# Patient Record
Sex: Female | Born: 1965 | Race: Asian | Hispanic: No | Marital: Married | Smoking: Never smoker
Health system: Southern US, Community
[De-identification: ages and names within clinical notes are randomized; demographics above are authoritative.]

---

## 2007-06-11 ENCOUNTER — Telehealth (INDEPENDENT_AMBULATORY_CARE_PROVIDER_SITE_OTHER): Payer: Self-pay | Admitting: *Deleted

## 2007-07-03 ENCOUNTER — Ambulatory Visit: Payer: Self-pay | Admitting: Nurse Practitioner

## 2007-07-03 DIAGNOSIS — N912 Amenorrhea, unspecified: Secondary | ICD-10-CM

## 2007-07-03 LAB — CONVERTED CEMR LAB
ALT: 13 units/L (ref 0–35)
AST: 19 units/L (ref 0–37)
Basophils Absolute: 0 10*3/uL (ref 0.0–0.1)
Basophils Relative: 0 % (ref 0–1)
Beta hcg, urine, semiquantitative: POSITIVE
Calcium: 9.3 mg/dL (ref 8.4–10.5)
Chloride: 104 meq/L (ref 96–112)
Creatinine, Ser: 0.45 mg/dL (ref 0.40–1.20)
Hemoglobin: 11.8 g/dL — ABNORMAL LOW (ref 12.0–15.0)
MCHC: 33.7 g/dL (ref 30.0–36.0)
Monocytes Absolute: 0.6 10*3/uL (ref 0.2–0.7)
Neutro Abs: 6 10*3/uL (ref 1.7–7.7)
Neutrophils Relative %: 61 % (ref 43–77)
Platelets: 311 10*3/uL (ref 150–400)
RDW: 13.9 % (ref 11.5–14.0)
Sodium: 139 meq/L (ref 135–145)
Total Bilirubin: 0.3 mg/dL (ref 0.3–1.2)
Total Protein: 7.2 g/dL (ref 6.0–8.3)

## 2007-07-04 ENCOUNTER — Encounter (INDEPENDENT_AMBULATORY_CARE_PROVIDER_SITE_OTHER): Payer: Self-pay | Admitting: Nurse Practitioner

## 2007-09-24 ENCOUNTER — Ambulatory Visit (HOSPITAL_COMMUNITY): Admission: RE | Admit: 2007-09-24 | Discharge: 2007-09-24 | Payer: Self-pay | Admitting: Obstetrics & Gynecology

## 2007-11-17 ENCOUNTER — Ambulatory Visit (HOSPITAL_COMMUNITY): Admission: RE | Admit: 2007-11-17 | Discharge: 2007-11-17 | Payer: Self-pay | Admitting: Obstetrics & Gynecology

## 2007-12-10 ENCOUNTER — Ambulatory Visit (HOSPITAL_COMMUNITY): Admission: RE | Admit: 2007-12-10 | Discharge: 2007-12-10 | Payer: Self-pay | Admitting: Family Medicine

## 2007-12-22 ENCOUNTER — Ambulatory Visit: Payer: Self-pay | Admitting: Obstetrics and Gynecology

## 2007-12-22 ENCOUNTER — Encounter (HOSPITAL_COMMUNITY): Payer: Self-pay | Admitting: Obstetrics and Gynecology

## 2007-12-22 ENCOUNTER — Inpatient Hospital Stay (HOSPITAL_COMMUNITY): Admission: AD | Admit: 2007-12-22 | Discharge: 2007-12-23 | Payer: Self-pay | Admitting: Obstetrics & Gynecology

## 2008-05-04 IMAGING — US US OB DETAIL+14 WK
1 of 2 series · 14 of 28 positions shown · non-contrast
Comparison: none

OBSTETRICAL ULTRASOUND:

 This ultrasound exam was performed in the [HOSPITAL] Ultrasound Department.  The OB US report was generated in the AS system, and faxed to the ordering physician.  This report is also available in [REDACTED] PACS.

[Series 1: us ob detail +14 wk · 14 of 74 slices shown]
[im 1/74]
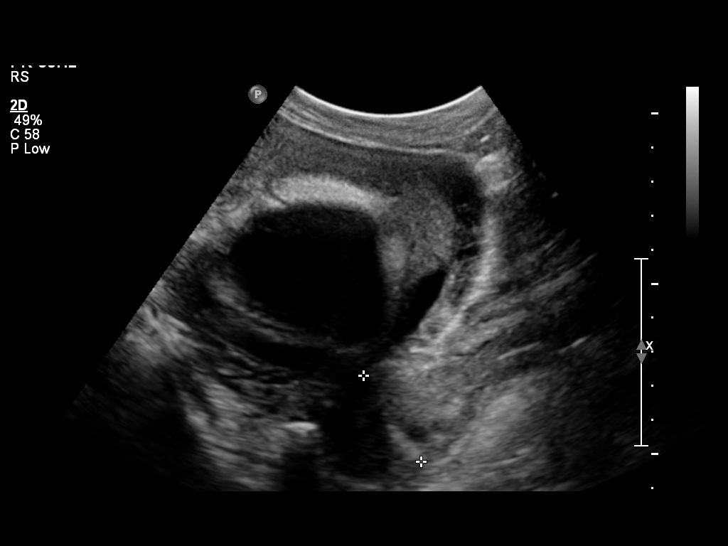
[im 6/74]
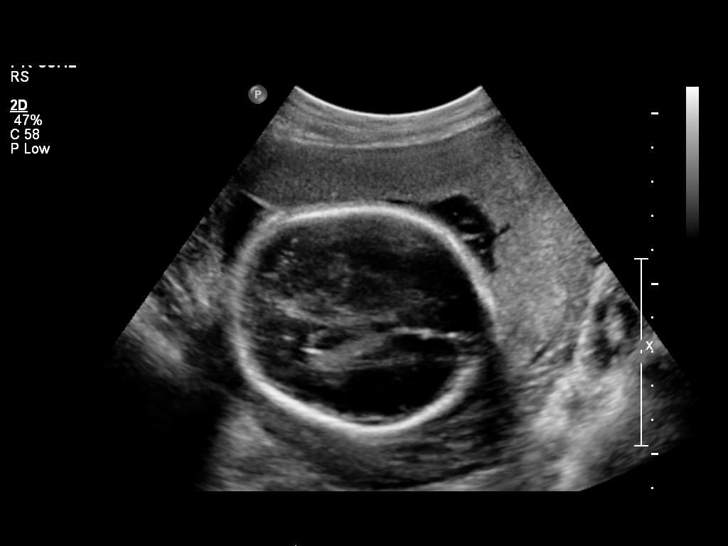
[im 12/74]
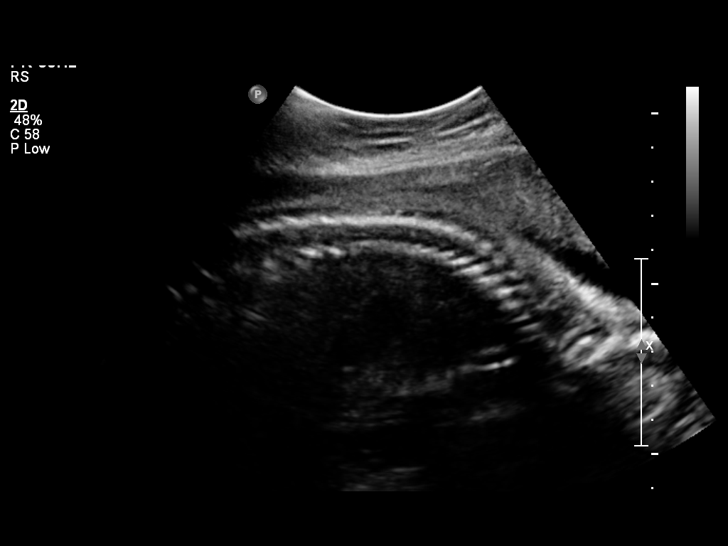
[im 17/74]
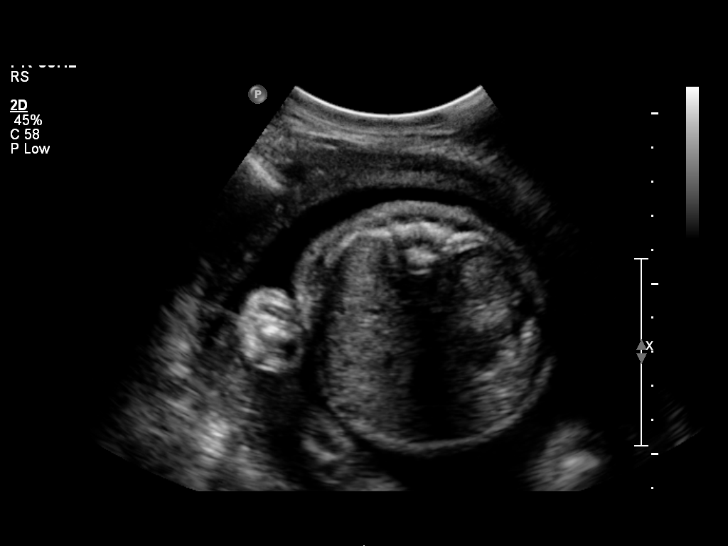
[im 23/74]
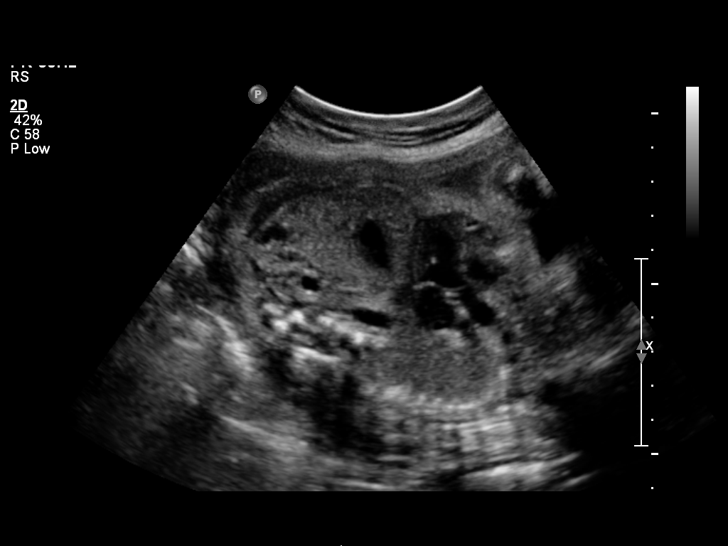
[im 29/74]
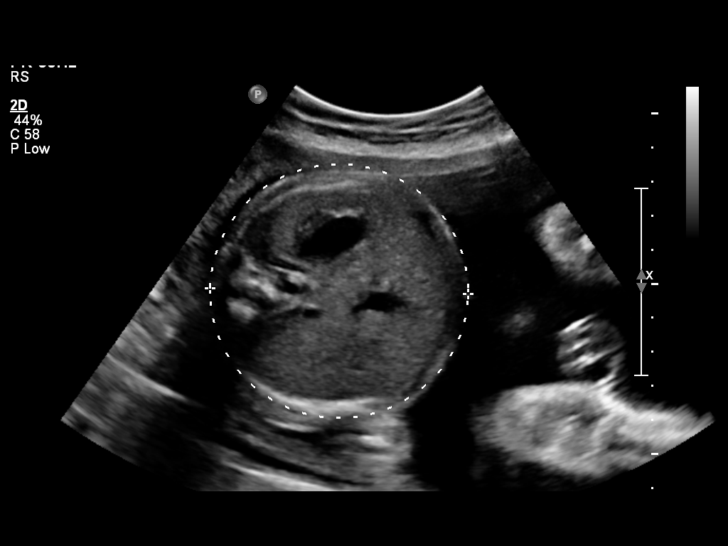
[im 34/74]
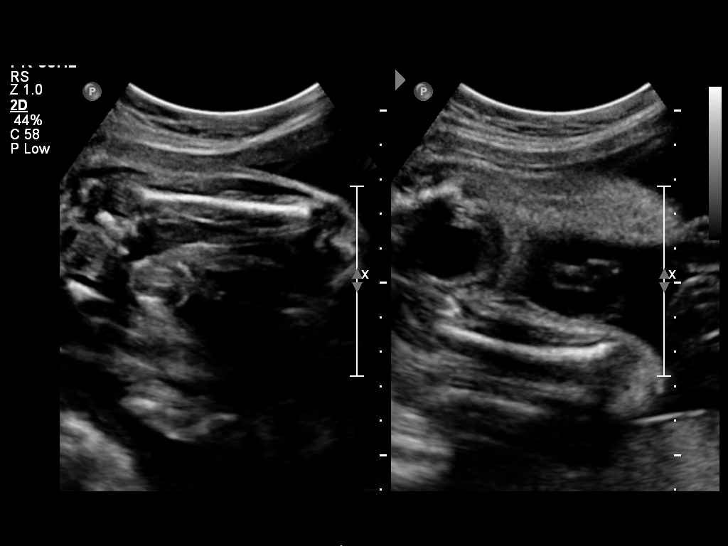
[im 40/74]
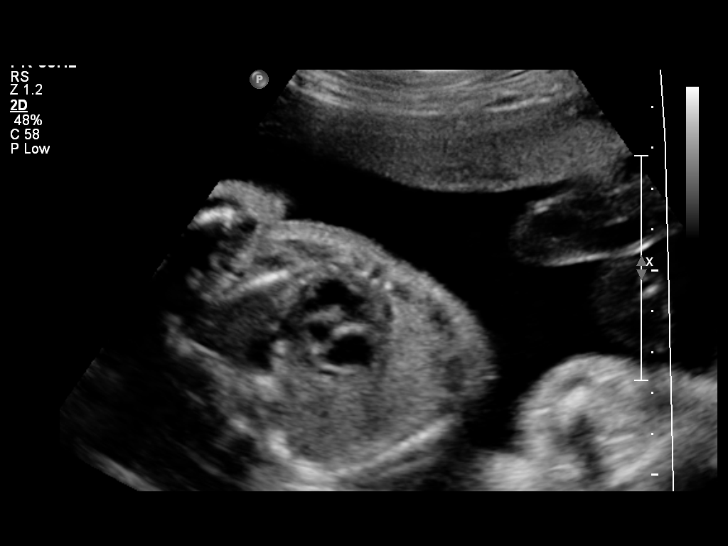
[im 45/74]
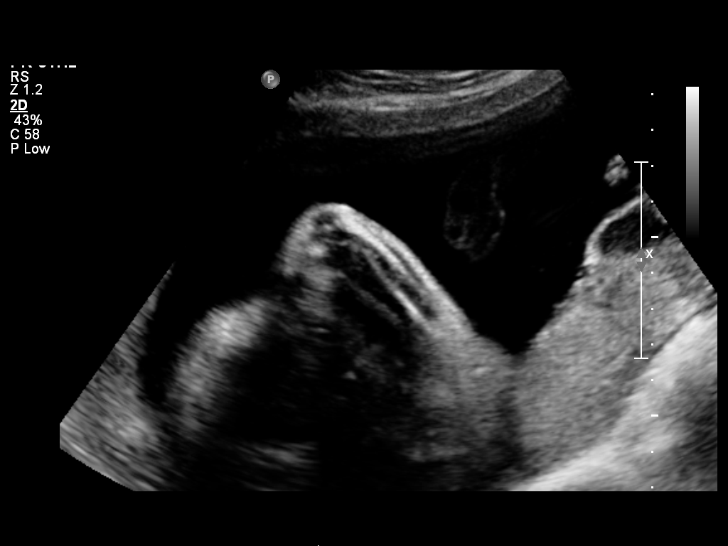
[im 51/74]
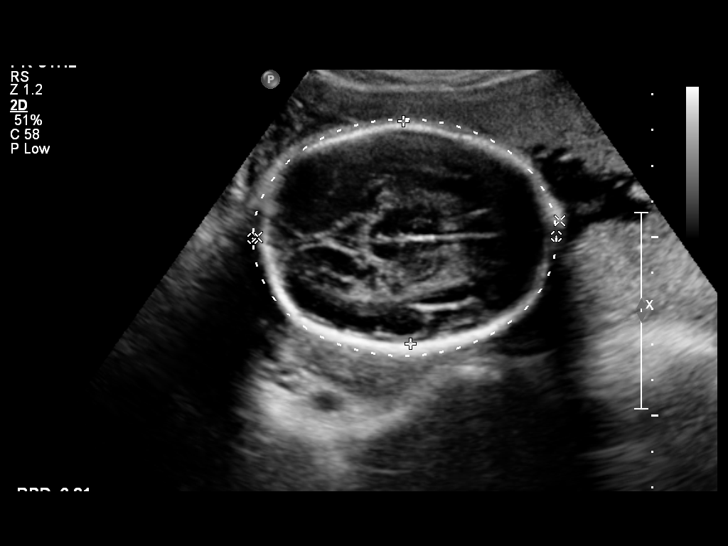
[im 57/74]
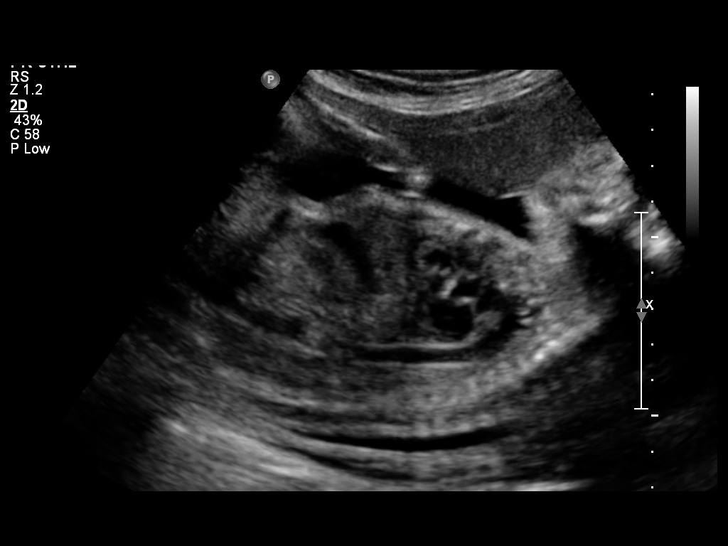
[im 62/74]
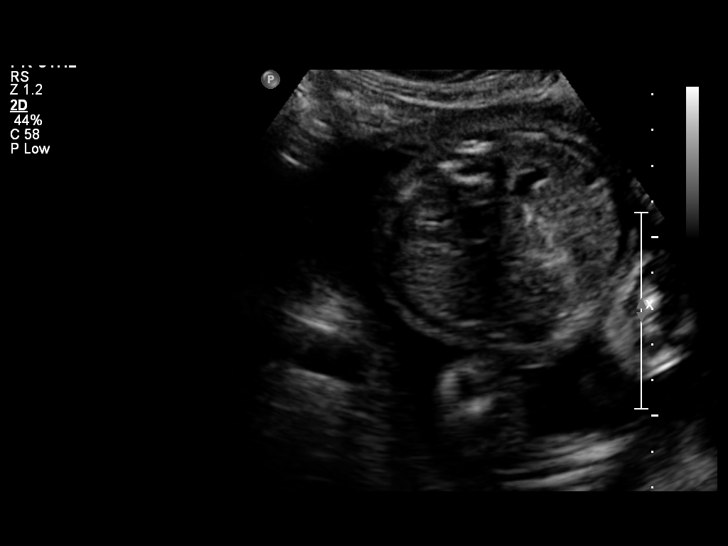
[im 68/74]
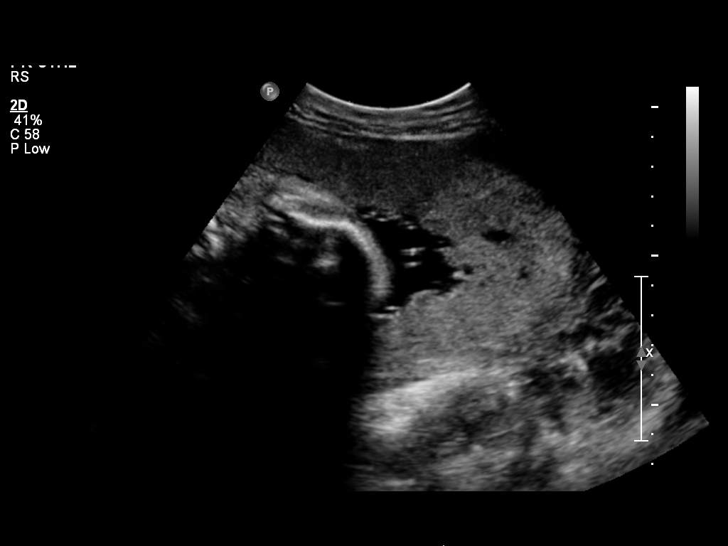
[im 74/74]
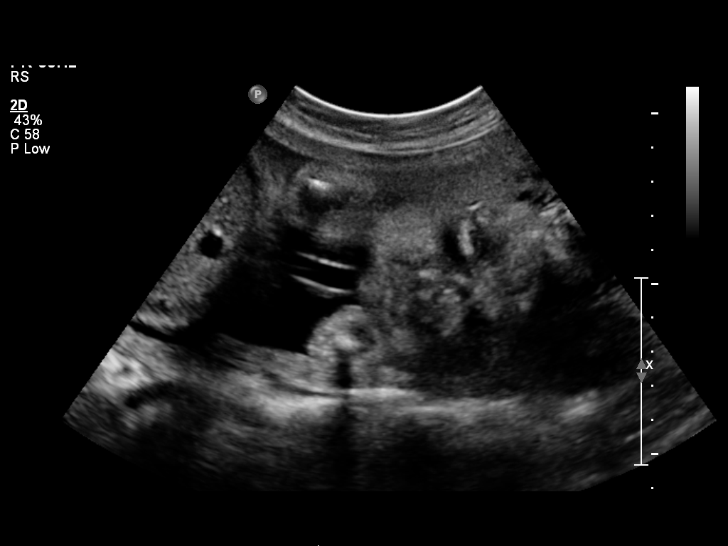

[14 of 28 positions shown; findings below may reference images not displayed]

IMPRESSION: See AS Obstetric US report.

## 2008-06-27 IMAGING — US US OB FOLLOW-UP
1 series · 14 of 28 positions shown · non-contrast
Comparison: none

OBSTETRICAL ULTRASOUND:
 This ultrasound exam was performed in the [HOSPITAL] Ultrasound Department.  The OB US report was generated in the AS system, and faxed to the ordering physician.  This report is also available in [REDACTED] PACS.

[Series 1: us ob follow-up · 0.26mm/px · 14 of 36 slices shown]
[im 2/36]
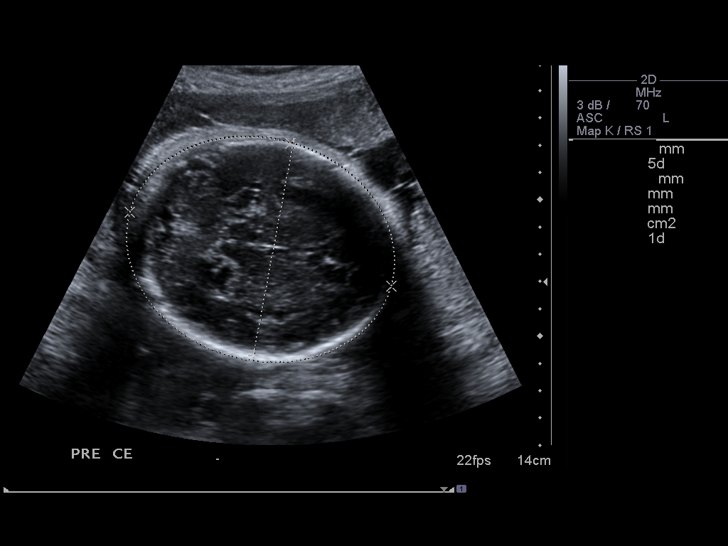
[im 4/36]
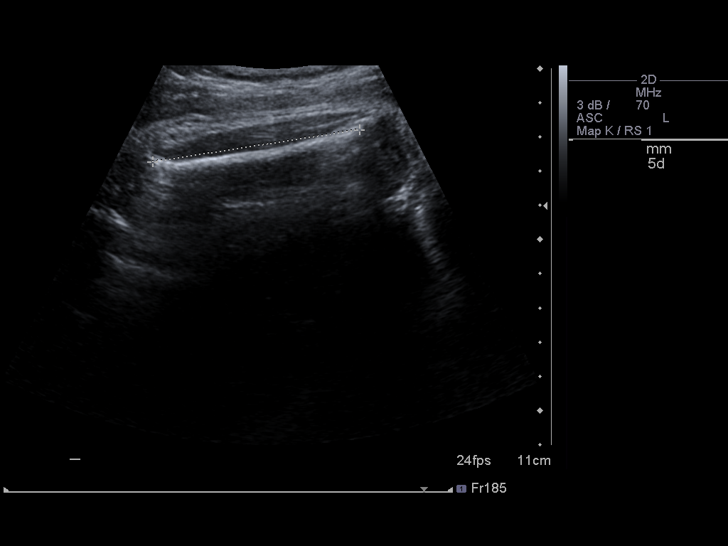
[im 7/36]
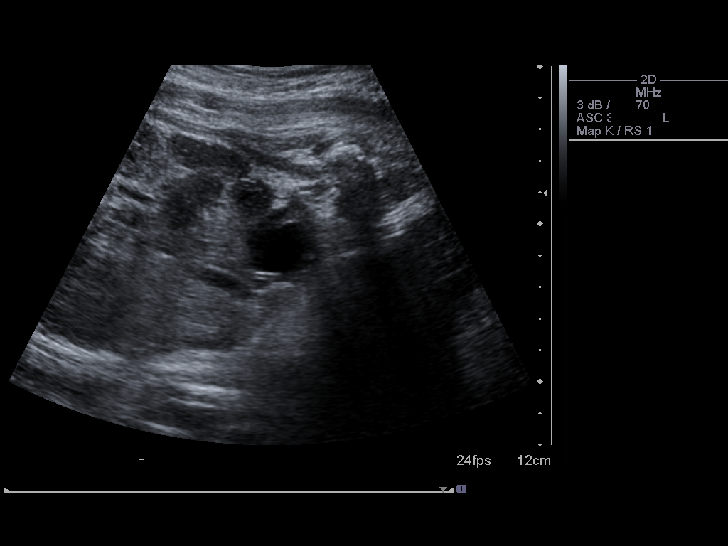
[im 10/36]
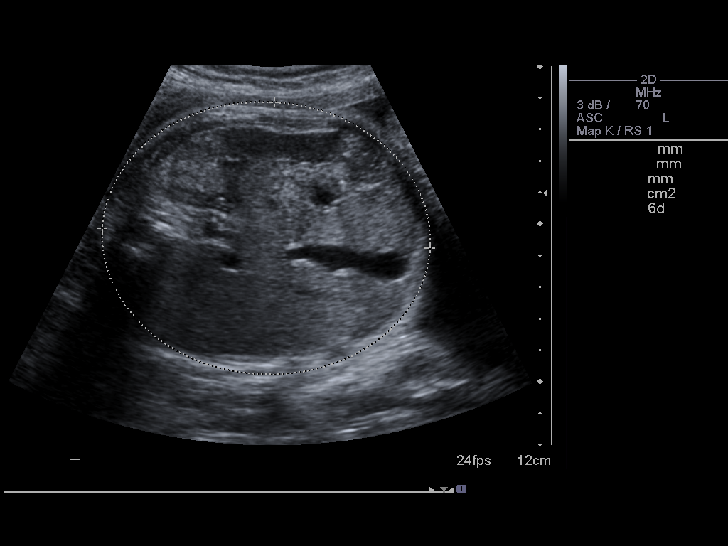
[im 12/36]
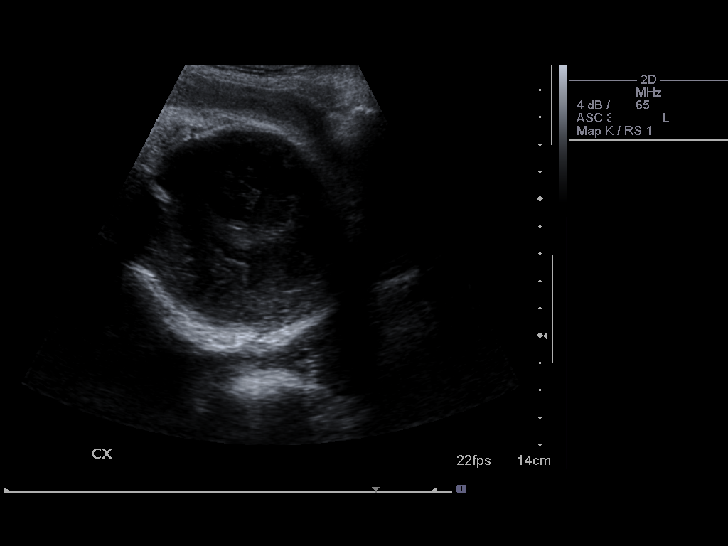
[im 15/36]
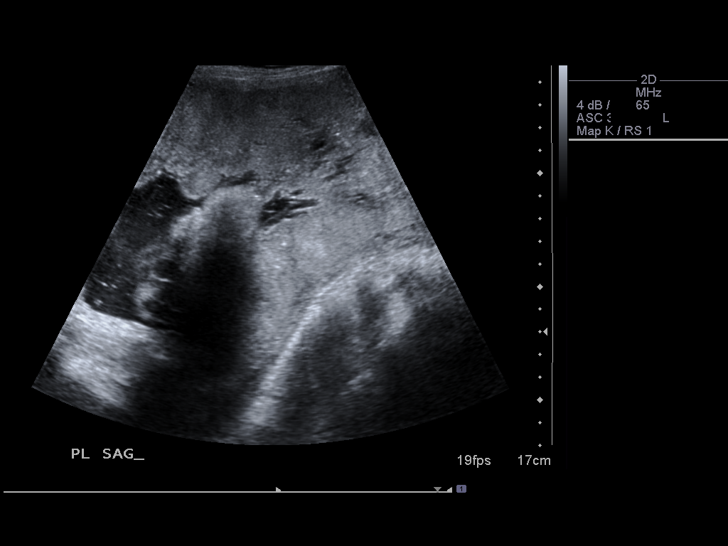
[im 17/36]
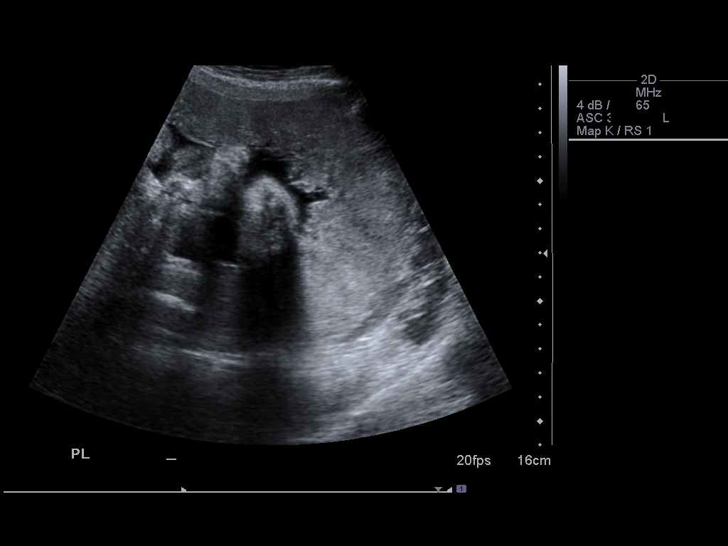
[im 20/36]
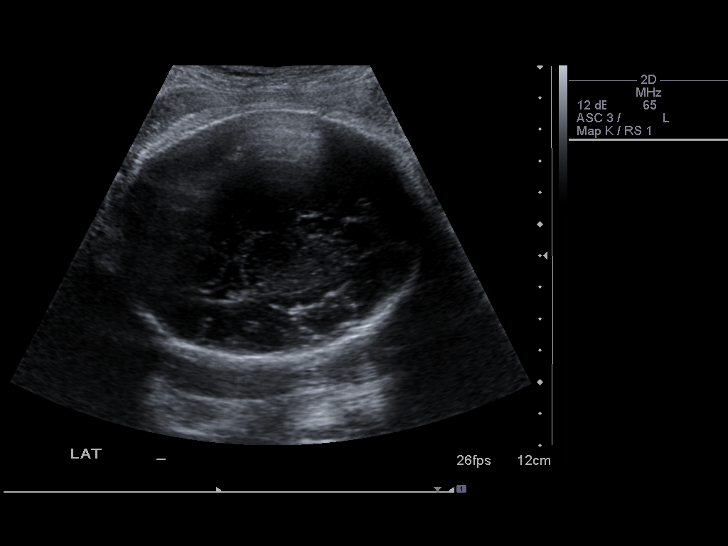
[im 23/36]
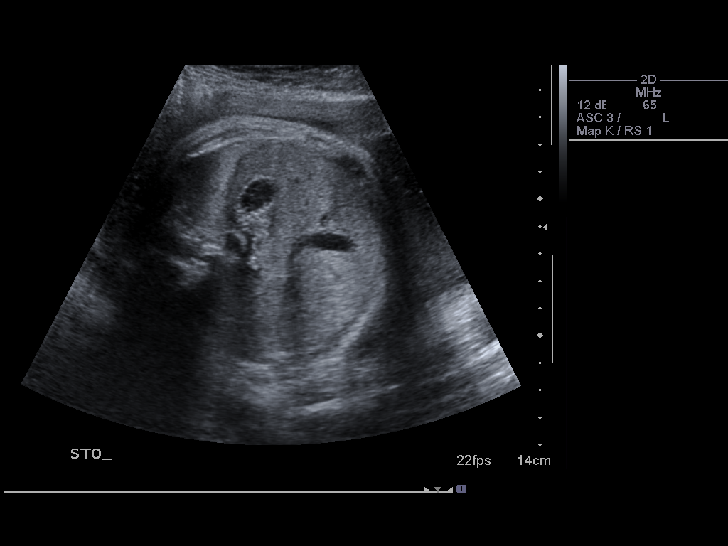
[im 25/36]
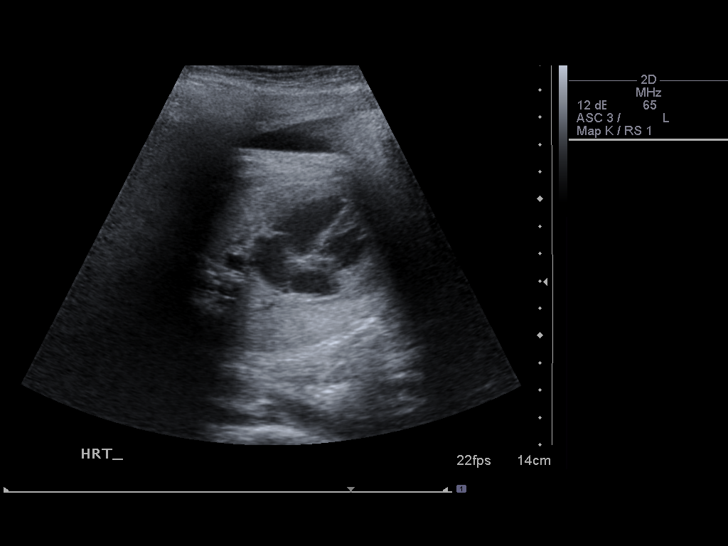
[im 28/36]
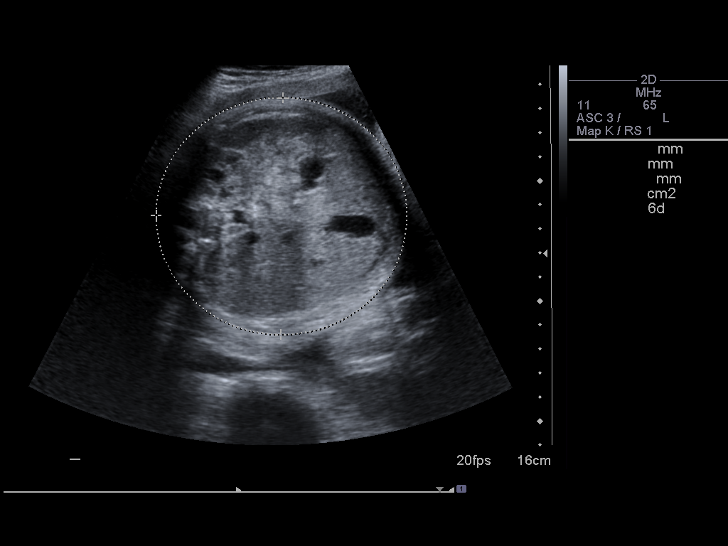
[im 30/36]
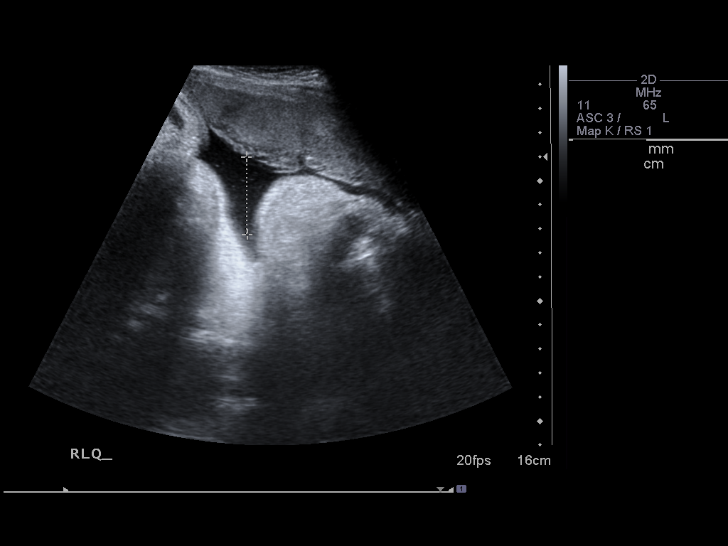
[im 33/36]
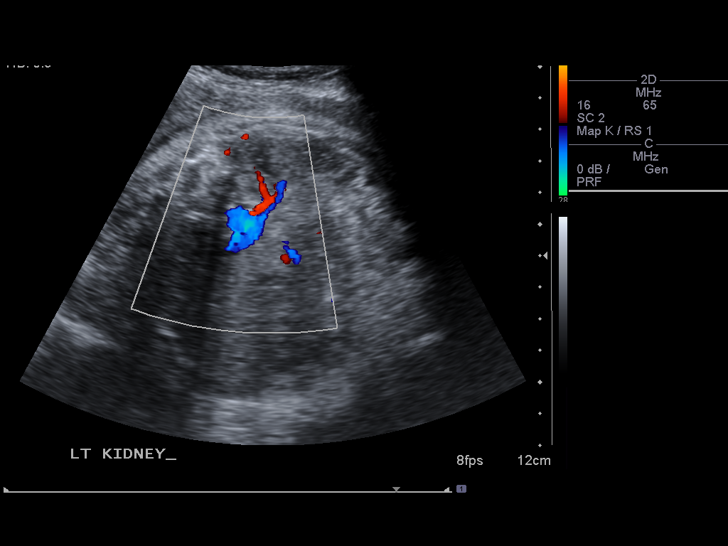
[im 36/36]
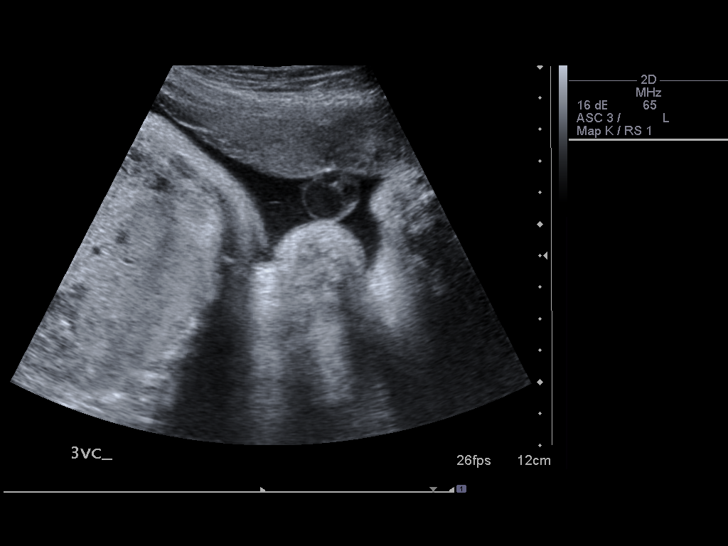

[14 of 28 positions shown; findings below may reference images not displayed]

IMPRESSION: See AS Obstetric US report.

## 2008-07-20 IMAGING — US US OB FOLLOW-UP
1 series · 14 of 26 positions shown · non-contrast
Comparison: none

OBSTETRICAL ULTRASOUND:
 This ultrasound exam was performed in the [HOSPITAL] Ultrasound Department.  The OB US report was generated in the AS system, and faxed to the ordering physician.  This report is also available in [REDACTED] PACS.

[Series 1: us ob follow-up · 0.30mm/px · 14 of 26 slices shown]
[im 1/26]
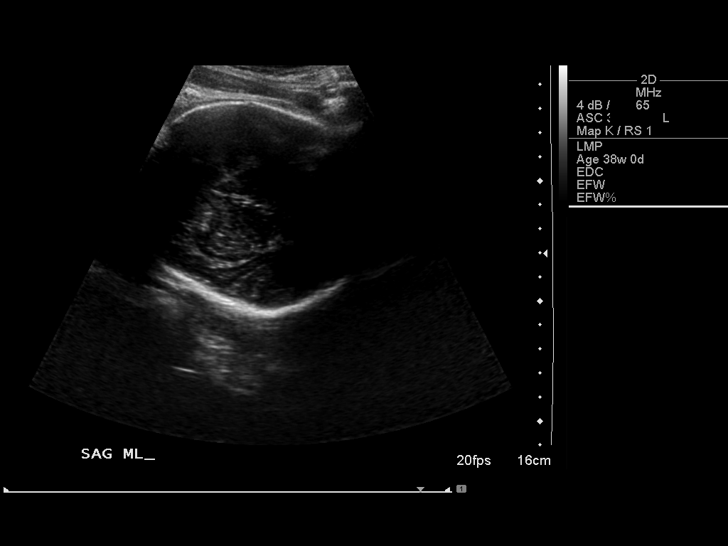
[im 3/26]
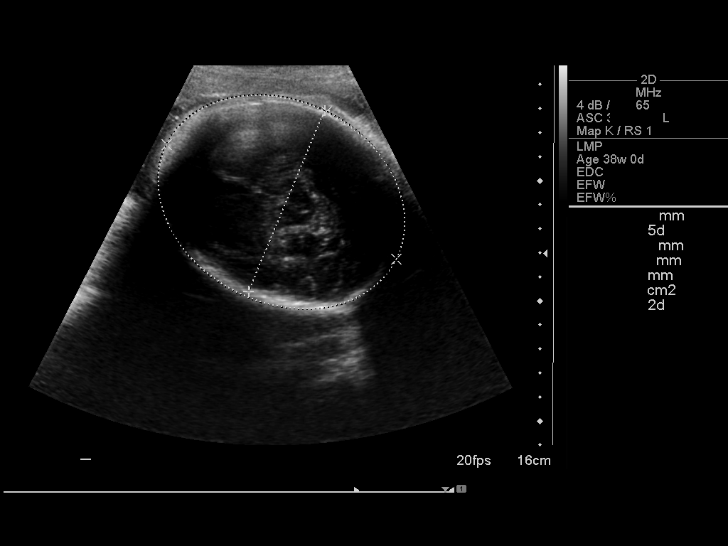
[im 5/26]
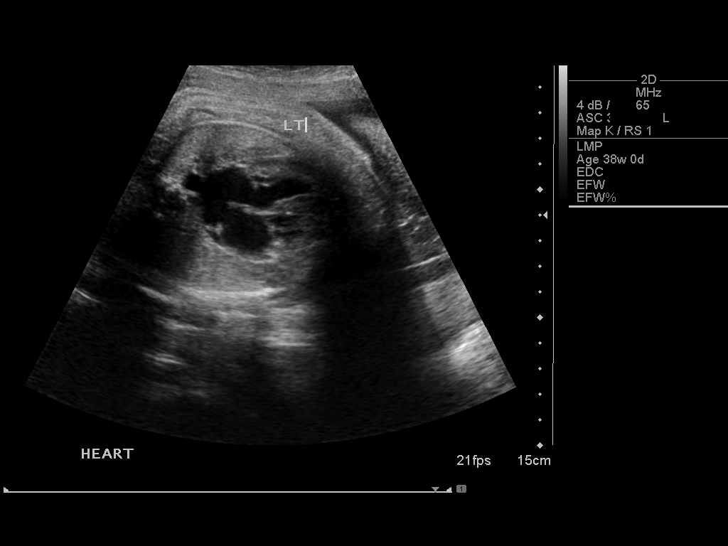
[im 7/26]
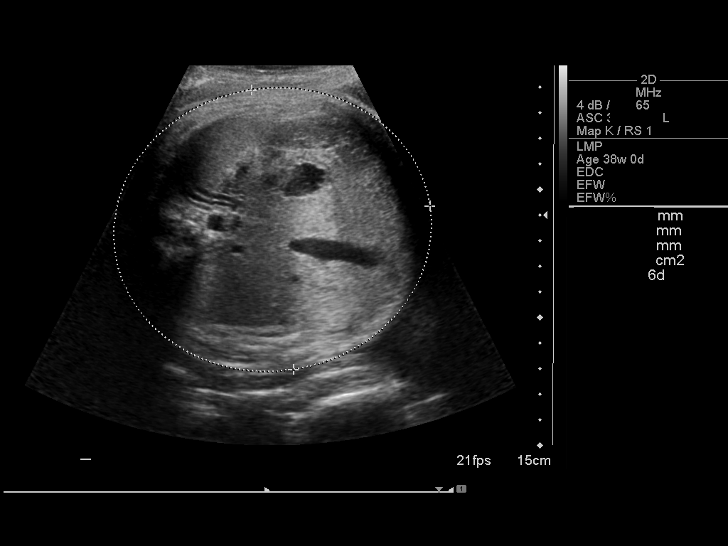
[im 9/26]
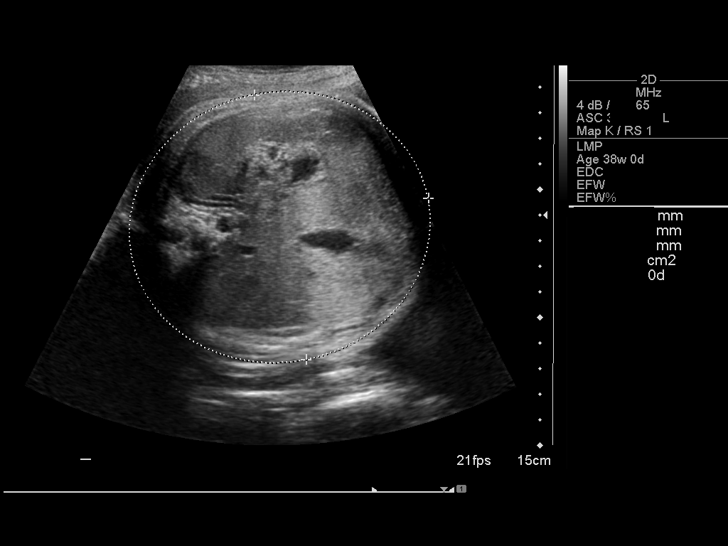
[im 11/26]
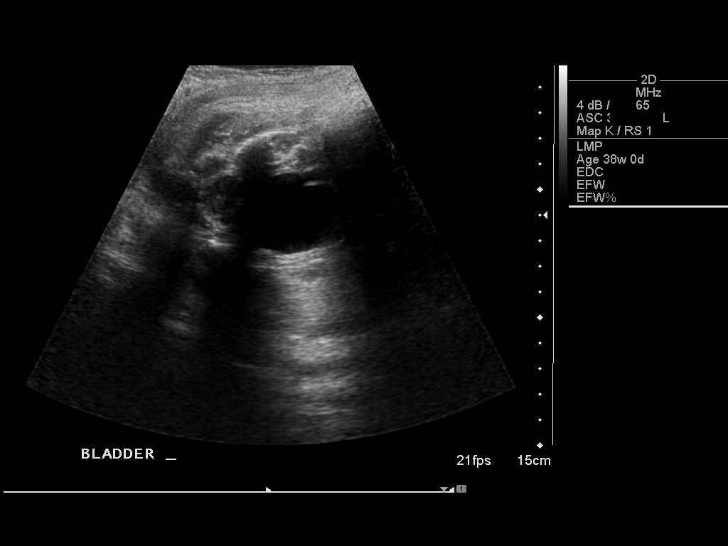
[im 13/26]
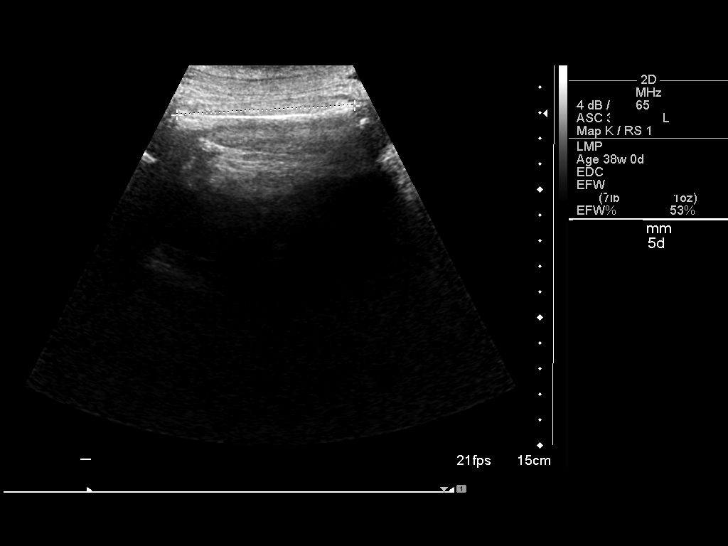
[im 14/26]
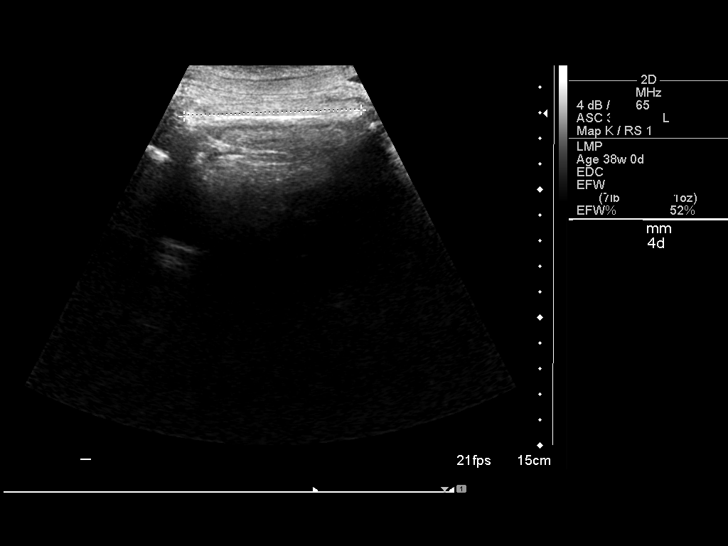
[im 16/26]
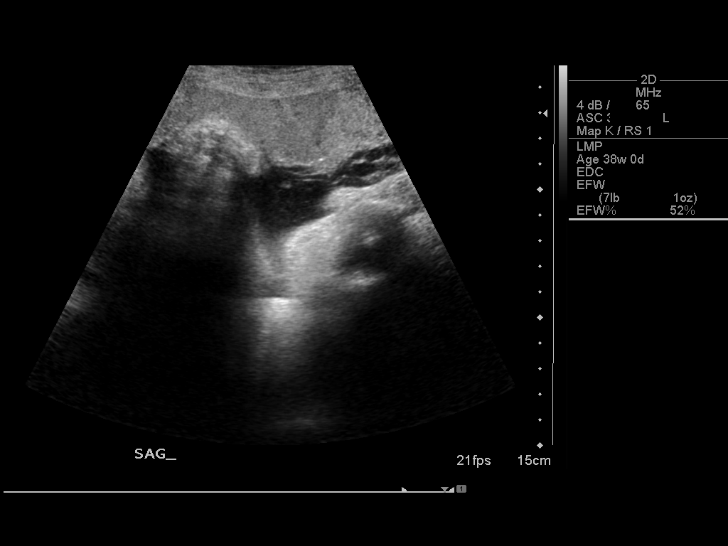
[im 18/26]
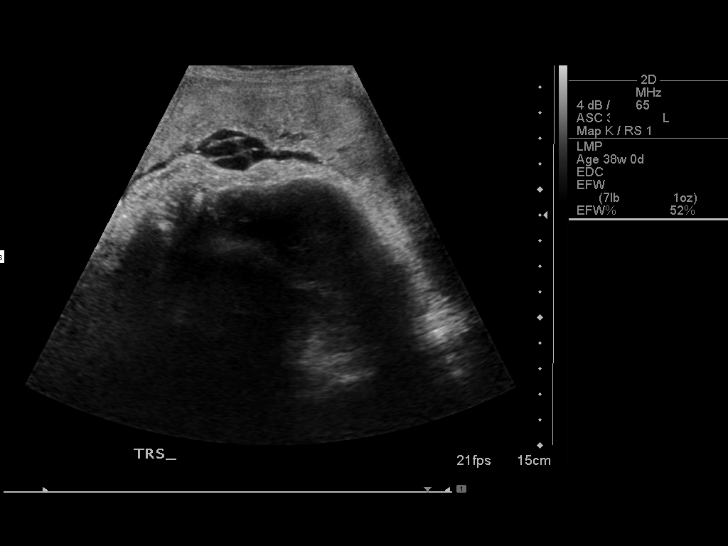
[im 20/26]
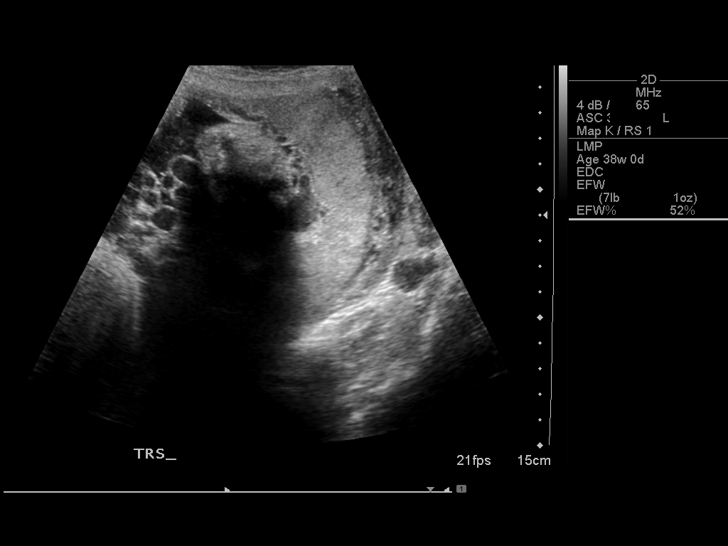
[im 22/26]
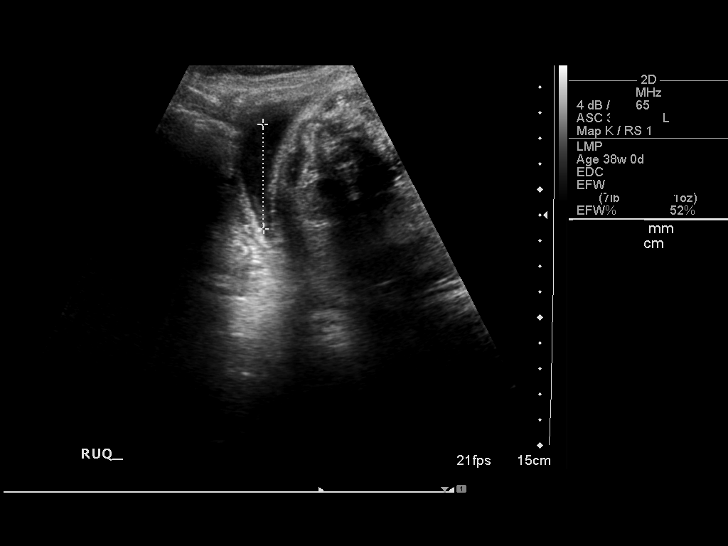
[im 24/26]
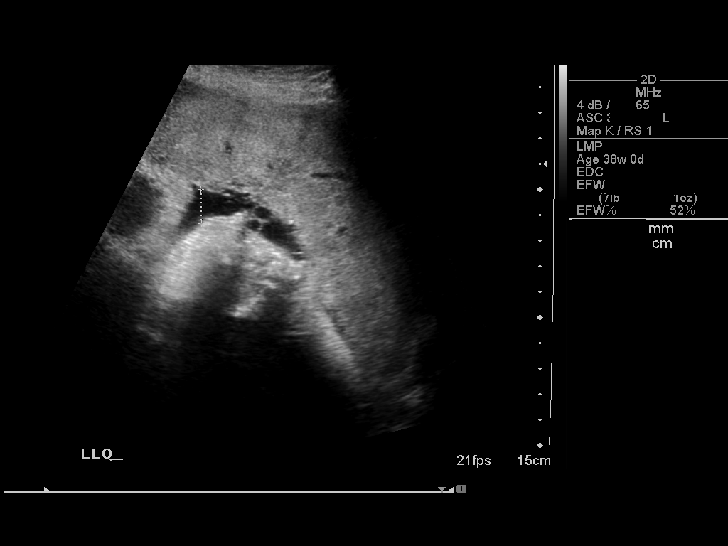
[im 26/26]
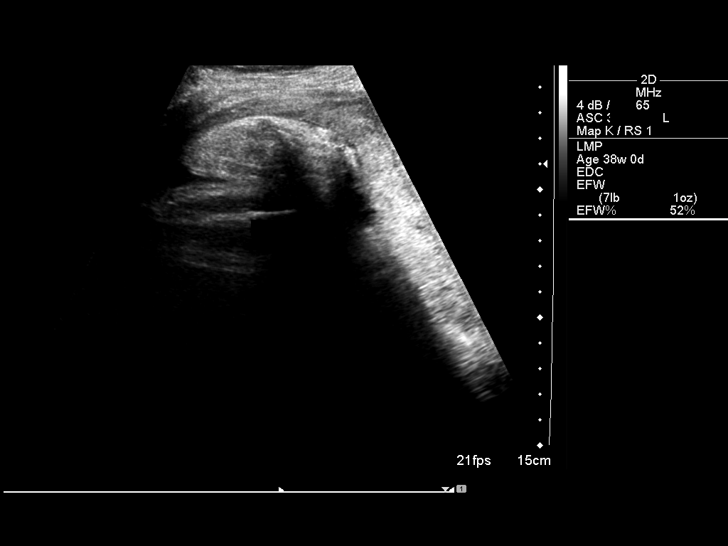

[14 of 26 positions shown; findings below may reference images not displayed]

IMPRESSION: See AS Obstetric US report.

## 2009-12-29 ENCOUNTER — Ambulatory Visit: Payer: Self-pay | Admitting: Internal Medicine

## 2011-05-22 LAB — CBC
HCT: 28.1 — ABNORMAL LOW
Hemoglobin: 9.6 — ABNORMAL LOW
MCHC: 34
RDW: 13.9

## 2012-06-18 ENCOUNTER — Emergency Department (HOSPITAL_COMMUNITY)
Admission: EM | Admit: 2012-06-18 | Discharge: 2012-06-18 | Disposition: A | Discharge status: Home / Self Care | Payer: Self-pay | Attending: Emergency Medicine | Admitting: Emergency Medicine

## 2012-06-18 ENCOUNTER — Encounter (HOSPITAL_COMMUNITY): Payer: Self-pay | Admitting: Emergency Medicine

## 2012-06-18 DIAGNOSIS — H9209 Otalgia, unspecified ear: Secondary | ICD-10-CM | POA: Insufficient documentation

## 2012-06-18 DIAGNOSIS — IMO0002 Reserved for concepts with insufficient information to code with codable children: Secondary | ICD-10-CM | POA: Insufficient documentation

## 2012-06-18 DIAGNOSIS — T169XXA Foreign body in ear, unspecified ear, initial encounter: Secondary | ICD-10-CM | POA: Insufficient documentation

## 2012-06-18 DIAGNOSIS — Y929 Unspecified place or not applicable: Secondary | ICD-10-CM | POA: Insufficient documentation

## 2012-06-18 DIAGNOSIS — T162XXA Foreign body in left ear, initial encounter: Secondary | ICD-10-CM

## 2012-06-18 DIAGNOSIS — Y939 Activity, unspecified: Secondary | ICD-10-CM | POA: Insufficient documentation

## 2012-06-18 MED ORDER — ANTIPYRINE-BENZOCAINE 5.4-1.4 % OT SOLN
3.0000 [drp] | Freq: Once | OTIC | Status: AC
  Administered 2012-06-18: 4 [drp] via OTIC
  Filled 2012-06-18: qty 10

## 2012-06-18 MED ORDER — CIPROFLOXACIN-DEXAMETHASONE 0.3-0.1 % OT SUSP
4.0000 [drp] | Freq: Once | OTIC | Status: AC
  Administered 2012-06-18: 4 [drp] via OTIC
  Filled 2012-06-18: qty 7.5

## 2012-06-18 NOTE — ED Provider Notes (Signed)
Medical screening examination/treatment/procedure(s) were performed by non-physician practitioner and as supervising physician I was immediately available for consultation/collaboration.  Yaret Hush T Rayhana Slider, MD 06/18/12 1652 

## 2012-06-18 NOTE — ED Provider Notes (Signed)
History     CSN: [id number]  Arrival date & time 06/18/12  0906   First MD Initiated Contact with Patient 06/18/12 [phone]      Chief Complaint  Patient presents with  . Foreign Body in Ear    (Consider location/radiation/quality/duration/timing/severity/associated sxs/prior treatment) HPI  46 year old female presents complaining of foreign object in her left ear. Patient states a bug crawled into the left ear last night.  Reports sharp throbbing pain to L ear. Onset acute, persistent, moderate in severity, non radiating, with out drainage.  No jaw pain. No fever.  No other trauma. Has tried flushing with water last night without success.    No past medical history on file.  No past surgical history on file.  No family history on file.  History  Substance Use Topics  . Smoking status: Never Smoker   . Smokeless tobacco: Never Used  . Alcohol Use: No    OB History    Grav Para Term Preterm Abortions TAB SAB Ect Mult Living                  Review of Systems  Constitutional: Negative for fever.  HENT: Positive for ear pain. Negative for neck pain, tinnitus and ear discharge.   Skin: Negative for rash.    Allergies  Review of patient's allergies indicates no known allergies.  Home Medications  No current outpatient prescriptions on file.  BP 125/71  Pulse 80  Temp 98.7 F (37.1 C) (Oral)  Resp 18  SpO2 100%  Physical Exam  Nursing note and vitals reviewed. Constitutional: She appears well-developed and well-nourished.       Uncomfortable appearing.   HENT:  Head: Normocephalic and atraumatic.  Right Ear: External ear normal.  Nose: Nose normal.  Mouth/Throat: Oropharynx is clear and moist. No oropharyngeal exudate.       L ear: a bug can be seen in ear canal, not moving, surrounding erythema.  Unable to visualized TM.  Ear TTP to tragus.  No lymphadenopathy  Eyes: Conjunctivae normal are normal.  Neck: Neck supple.  Lymphadenopathy:    She has no  cervical adenopathy.  Neurological: She is alert.  Skin: Skin is warm. No rash noted.  Psychiatric: She has a normal mood and affect.    ED Course  FOREIGN BODY REMOVAL Date/Time: 06/18/2012 11:15 AM Performed by: Fayrene Helper Authorized by: Fayrene Helper Consent: Verbal consent obtained. Risks and benefits: risks, benefits and alternatives were discussed Consent given by: patient Patient understanding: patient states understanding of the procedure being performed Site marked: the operative site was marked Patient identity confirmed: verbally with patient and arm band Body area: ear Location details: left ear Local anesthetic: topical anesthetic Anesthetic total: 1 ml Anesthetic total: 3 drops Patient cooperative: yes Localization method: magnification and visualized Removal mechanism: forceps and irrigation Complexity: simple 1 objects recovered. Objects recovered: a wasp Post-procedure assessment: foreign body removed Comments: Intact TM post f/b removal.     (including critical care time)  Labs Reviewed - No data to display No results found.   No diagnosis found.  1. Foreign body removal, L ear  MDM  Pt has a small dead bug in her L ear, successful removal using irrigation, and forcep.  Will d/c with auralgan and ciprodex.  Care instruction given.    BP 125/71  Pulse 80  Temp 98.7 F (37.1 C) (Oral)  Resp 18  SpO2 100%       Fayrene Helper, PA-C 06/18/12 1118

## 2012-06-18 NOTE — ED Notes (Signed)
Pt states that a bug crawled into her left ear last night.  Has tried to flush it out with water but it hasn't come out.

## 2014-03-23 ENCOUNTER — Other Ambulatory Visit (HOSPITAL_COMMUNITY): Payer: Self-pay | Admitting: Nurse Practitioner

## 2014-03-23 DIAGNOSIS — Z1231 Encounter for screening mammogram for malignant neoplasm of breast: Secondary | ICD-10-CM

## 2014-03-29 ENCOUNTER — Ambulatory Visit (HOSPITAL_COMMUNITY)
Admission: RE | Admit: 2014-03-29 | Discharge: 2014-03-29 | Disposition: A | Discharge status: Home / Self Care | Payer: Medicaid Other | Source: Ambulatory Visit | Attending: Nurse Practitioner | Admitting: Nurse Practitioner

## 2014-03-29 DIAGNOSIS — Z1231 Encounter for screening mammogram for malignant neoplasm of breast: Secondary | ICD-10-CM

## 2019-11-20 ENCOUNTER — Ambulatory Visit: Payer: Medicaid Other | Attending: Internal Medicine

## 2019-11-20 DIAGNOSIS — Z23 Encounter for immunization: Secondary | ICD-10-CM

## 2019-11-20 NOTE — Progress Notes (Signed)
   Covid-19 Vaccination Clinic  Name:  Cynthia Banks    MRN: [id number] DOB: [birth year]  11/20/2019  [name] was observed post Covid-19 immunization for 15 minutes without incident. She was provided with Vaccine Information Sheet and instruction to access the V-Safe system.   [name] was instructed to call 911 with any severe reactions post vaccine: Marland Kitchen Difficulty breathing  . Swelling of face and throat  . A fast heartbeat  . A bad rash all over body  . Dizziness and weakness   Immunizations Administered    Name Date Dose VIS Date Route   Pfizer COVID-19 Vaccine 11/20/2019  8:36 AM 0.3 mL 08/07/2019 Intramuscular   Manufacturer: [employer], [employer]   Lot: [id number]   NDC: [address]-1

## 2019-12-16 ENCOUNTER — Ambulatory Visit: Payer: Medicaid Other | Attending: Internal Medicine

## 2019-12-16 DIAGNOSIS — Z23 Encounter for immunization: Secondary | ICD-10-CM

## 2019-12-16 NOTE — Progress Notes (Signed)
   Covid-19 Vaccination Clinic  Name:  Cynthia Banks    MRN: [id number] DOB: 1966-02-13  12/16/2019  [name] was observed post Covid-19 immunization for 15 minutes without incident. She was provided with Vaccine Information Sheet and instruction to access the V-Safe system.   [name] was instructed to call 911 with any severe reactions post vaccine: Marland Kitchen Difficulty breathing  . Swelling of face and throat  . A fast heartbeat  . A bad rash all over body  . Dizziness and weakness   Immunizations Administered    Name Date Dose VIS Date Route   Pfizer COVID-19 Vaccine 12/16/2019  8:31 AM 0.3 mL 10/21/2018 Intramuscular   Manufacturer: [employer], [employer]   Lot: [id number]   NDC: [address]-4

## 2023-01-22 ENCOUNTER — Encounter: Payer: Self-pay | Admitting: Family Medicine

## 2023-01-22 NOTE — Progress Notes (Unsigned)
Office Note 01/23/2023  CC:  Chief Complaint  Patient presents with   Establish Care   HPI:  Cynthia Banks is a 57 y.o. female who is here accompanied by her daughter to establish care/CPE. Patient's most recent primary MD: None Old records: None available for review.  Feels well. No significant past medical or past surgical history. Has never been hospitalized and has never been on any medication for chronic diseases.  History reviewed. No pertinent past medical history.  History reviewed. No pertinent surgical history.  History reviewed. No pertinent family history.  Social History   Socioeconomic History   Marital status: Married    Spouse name: Caffie Pinto   Number of children: Not on file   Years of education: Not on file   Highest education level: High school graduate  Occupational History   Not on file  Tobacco Use   Smoking status: Never   Smokeless tobacco: Never  Substance and Sexual Activity   Alcohol use: No   Drug use: No   Sexual activity: Yes    Partners: Male    Comment: married  Other Topics Concern   Not on file  Social History Narrative   Married, 7 children.  1 grandchild.   Originally from [place].  Relocated to the [place] 2007.   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   No Tob or alc.   No signif dietary restictions.   Social Determinants of Health   Financial Resource Strain: Not on file  Food Insecurity: Not on file  Transportation Needs: Not on file  Physical Activity: Not on file  Stress: Not on file  Social Connections: Not on file  Intimate Partner Violence: Not on file    No outpatient encounter medications on file as of 01/23/2023.   No facility-administered encounter medications on file as of 01/23/2023.    No Known Allergies  Review of Systems  Constitutional:  Negative for appetite change, chills, fatigue and fever.  HENT:  Negative for congestion, dental problem, ear pain and sore throat.   Eyes:  Negative for  discharge, redness and visual disturbance.  Respiratory:  Negative for cough, chest tightness, shortness of breath and wheezing.   Cardiovascular:  Negative for chest pain, palpitations and leg swelling.  Gastrointestinal:  Negative for abdominal pain, blood in stool, diarrhea, nausea and vomiting.  Genitourinary:  Negative for difficulty urinating, dysuria, flank pain, frequency, hematuria and urgency.  Musculoskeletal:  Negative for arthralgias, back pain, joint swelling, myalgias and neck stiffness.  Skin:  Negative for pallor and rash.  Neurological:  Negative for dizziness, speech difficulty, weakness and headaches.  Hematological:  Negative for adenopathy. Does not bruise/bleed easily.  Psychiatric/Behavioral:  Negative for confusion and sleep disturbance. The patient is not nervous/anxious.     PE; Blood pressure 117/81, pulse 64, height 5\' 2"  (1.575 m), weight 117 lb 9.6 oz (53.3 kg), SpO2 98 %.  Physical Exam Body mass index is 21.51 kg/m.  Gen: Alert, well appearing.  Patient is oriented to person, place, time, and situation. AFFECT: pleasant, lucid thought and speech. ENT: Ears: EACs clear, normal epithelium.  TMs with good light reflex and landmarks bilaterally.  Eyes: no injection, icteris, swelling, or exudate.  EOMI, PERRLA. Nose: no drainage or turbinate edema/swelling.  No injection or focal lesion.  Mouth: lips without lesion/swelling.  Oral mucosa pink and moist.  Dentition intact and without obvious caries or gingival swelling.  Oropharynx without erythema, exudate, or swelling.  Neck: supple/nontender.  No LAD, mass, or TM.  Carotid pulses 2+ bilaterally, without bruits. CV: RRR, no m/r/g.   LUNGS: CTA bilat, nonlabored resps, good aeration in all lung fields. ABD: soft, NT, ND, BS normal.  No hepatospenomegaly or mass.  No bruits. EXT: no clubbing, cyanosis, or edema.  Musculoskeletal: no joint swelling, erythema, warmth, or tenderness.  ROM of all joints  intact. Skin - no sores or suspicious lesions or rashes or color changes  Pertinent labs:  None  ASSESSMENT AND PLAN:   No problem-specific Assessment & Plan notes found for this encounter.  New patient, establishing care.  Health maintenance exam: Reviewed age and gender appropriate health maintenance issues (prudent diet, regular exercise, health risks of tobacco and excessive alcohol, use of seatbelts, fire alarms in home, use of sunscreen).  Also reviewed age and gender appropriate health screening as well as vaccine recommendations. Vaccines: Tdap->given today.  Shingrix #1 given today. Labs: Patient declined Cervical ca screening: Most recent Pap smear approximately 2010--> declined any further cervical cancer screening. Breast ca screening: Most recent mammogram in our system was 03/29/2014.  Mammogram ordered today. Colon ca screening: Referral to gastroenterology for colonoscopy ordered today.  An After Visit Summary was printed and given to the patient.  Return in about 1 year (around 01/23/2024) for annual CPE (fasting).  Signed:  Santiago Bumpers, MD           01/23/2023

## 2023-01-23 ENCOUNTER — Encounter: Payer: Self-pay | Admitting: Family Medicine

## 2023-01-23 ENCOUNTER — Ambulatory Visit: Payer: BLUE CROSS/BLUE SHIELD | Admitting: Family Medicine

## 2023-01-23 ENCOUNTER — Ambulatory Visit: Payer: Self-pay | Admitting: Family Medicine

## 2023-01-23 VITALS — BP 117/81 | HR 64 | Ht 62.0 in | Wt 117.6 lb

## 2023-01-23 DIAGNOSIS — Z1231 Encounter for screening mammogram for malignant neoplasm of breast: Secondary | ICD-10-CM

## 2023-01-23 DIAGNOSIS — Z0000 Encounter for general adult medical examination without abnormal findings: Secondary | ICD-10-CM | POA: Diagnosis not present

## 2023-01-23 DIAGNOSIS — Z1211 Encounter for screening for malignant neoplasm of colon: Secondary | ICD-10-CM

## 2023-01-23 DIAGNOSIS — Z23 Encounter for immunization: Secondary | ICD-10-CM | POA: Diagnosis not present

## 2023-01-23 NOTE — Patient Instructions (Signed)

## 2023-04-08 ENCOUNTER — Ambulatory Visit
Admission: RE | Admit: 2023-04-08 | Discharge: 2023-04-08 | Disposition: A | Discharge status: Home / Self Care | Payer: BLUE CROSS/BLUE SHIELD | Source: Ambulatory Visit | Attending: Family Medicine | Admitting: Family Medicine

## 2023-04-08 DIAGNOSIS — Z1231 Encounter for screening mammogram for malignant neoplasm of breast: Secondary | ICD-10-CM

## 2023-05-06 ENCOUNTER — Ambulatory Visit (INDEPENDENT_AMBULATORY_CARE_PROVIDER_SITE_OTHER): Payer: BLUE CROSS/BLUE SHIELD

## 2023-05-06 DIAGNOSIS — Z23 Encounter for immunization: Secondary | ICD-10-CM | POA: Diagnosis not present

## 2023-11-22 ENCOUNTER — Encounter: Admitting: Family Medicine

## 2023-12-12 ENCOUNTER — Encounter: Admitting: Family Medicine

## 2024-02-19 ENCOUNTER — Encounter: Admitting: Family Medicine

## 2024-07-13 ENCOUNTER — Ambulatory Visit: Admitting: Family Medicine

## 2024-07-13 ENCOUNTER — Encounter: Admitting: Family Medicine

## 2024-07-13 VITALS — BP 150/86 | HR 60 | Temp 97.7°F | Ht 62.25 in | Wt 116.6 lb

## 2024-07-13 DIAGNOSIS — Z1231 Encounter for screening mammogram for malignant neoplasm of breast: Secondary | ICD-10-CM

## 2024-07-13 DIAGNOSIS — Z0000 Encounter for general adult medical examination without abnormal findings: Secondary | ICD-10-CM

## 2024-07-13 DIAGNOSIS — Z23 Encounter for immunization: Secondary | ICD-10-CM | POA: Diagnosis not present

## 2024-07-13 DIAGNOSIS — I10 Essential (primary) hypertension: Secondary | ICD-10-CM

## 2024-07-13 LAB — CBC WITH DIFFERENTIAL/PLATELET
Basophils Absolute: 0 K/uL (ref 0.0–0.1)
Basophils Relative: 0.5 % (ref 0.0–3.0)
Eosinophils Absolute: 0.1 K/uL (ref 0.0–0.7)
Eosinophils Relative: 2.3 % (ref 0.0–5.0)
HCT: 36.9 % (ref 36.0–46.0)
Hemoglobin: 12 g/dL (ref 12.0–15.0)
Lymphocytes Relative: 44.1 % (ref 12.0–46.0)
Lymphs Abs: 2.4 K/uL (ref 0.7–4.0)
MCHC: 32.6 g/dL (ref 30.0–36.0)
MCV: 81.4 fl (ref 78.0–100.0)
Monocytes Absolute: 0.3 K/uL (ref 0.1–1.0)
Monocytes Relative: 6.2 % (ref 3.0–12.0)
Neutro Abs: 2.5 K/uL (ref 1.4–7.7)
Neutrophils Relative %: 46.9 % (ref 43.0–77.0)
Platelets: 275 K/uL (ref 150.0–400.0)
RBC: 4.53 Mil/uL (ref 3.87–5.11)
RDW: 15.1 % (ref 11.5–15.5)
WBC: 5.4 K/uL (ref 4.0–10.5)

## 2024-07-13 NOTE — Progress Notes (Signed)
 Office Note 07/13/2024  CC:  Chief Complaint  Patient presents with   Annual Exam    Pt is fasting   HPI:  Patient is a 58 y.o. female who is here accompanied by her daughter for annual health maintenance exam.  She has no acute concerns. She does check her blood pressure some at home, most recently this morning.  Systolic around 150 as well as she can recall.  History reviewed. No pertinent past medical history.  History reviewed. No pertinent surgical history.  Family History  Problem Relation Age of Onset   Breast cancer Neg Hx     Social History   Socioeconomic History   Marital status: Married    Spouse name: Milburn Saltness   Number of children: Not on file   Years of education: Not on file   Highest education level: 12th grade  Occupational History   Not on file  Tobacco Use   Smoking status: Never   Smokeless tobacco: Never  Substance and Sexual Activity   Alcohol use: No   Drug use: No   Sexual activity: Yes    Partners: Male    Comment: married  Other Topics Concern   Not on file  Social History Narrative   Married, 7 children.  1 grandchild.   Originally from [place].  [place] the [place]  2007.   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   No Tob or alc.   No signif dietary restictions.   Social Drivers of Corporate Investment Banker Strain: Low Risk  (07/13/2024)   Overall Financial Resource Strain (CARDIA)    Difficulty of Paying Living Expenses: Not very hard  Food Insecurity: No Food Insecurity (07/13/2024)   Hunger Vital Sign    Worried About Running Out of Food in the Last Year: Never true    Ran Out of Food in the Last Year: Never true  Transportation Needs: No Transportation Needs (07/13/2024)   PRAPARE - Administrator, Civil Service (Medical): No    Lack of Transportation (Non-Medical): No  Physical Activity: Insufficiently Active (07/13/2024)   Exercise Vital Sign    Days of Exercise per Week: 2 days    Minutes of  Exercise per Session: 20 min  Stress: No Stress Concern Present (07/13/2024)   [employer] of Occupational Health - Occupational Stress Questionnaire    Feeling of Stress: Not at all  Social Connections: Moderately Isolated (07/13/2024)   Social Connection and Isolation Panel    Frequency of Communication with Friends and Family: Once a week    Frequency of Social Gatherings with Friends and Family: Once a week    Attends Religious Services: 1 to 4 times per year    Active Member of [employer] or Organizations: No    Attends Engineer, Structural: Not on file    Marital Status: Married  Catering Manager Violence: Not on file    No outpatient medications prior to visit.   No facility-administered medications prior to visit.    No Known Allergies  Review of Systems  Constitutional:  Negative for appetite change, chills, fatigue and fever.  HENT:  Negative for congestion, dental problem, ear pain and sore throat.   Eyes:  Negative for discharge, redness and visual disturbance.  Respiratory:  Negative for cough, chest tightness, shortness of breath and wheezing.   Cardiovascular:  Negative for chest pain, palpitations and leg swelling.  Gastrointestinal:  Negative for abdominal pain, blood in stool, diarrhea, nausea  and vomiting.  Genitourinary:  Negative for difficulty urinating, dysuria, flank pain, frequency, hematuria and urgency.  Musculoskeletal:  Negative for arthralgias, back pain, joint swelling, myalgias and neck stiffness.  Skin:  Negative for pallor and rash.  Neurological:  Negative for dizziness, speech difficulty, weakness and headaches.  Hematological:  Negative for adenopathy. Does not bruise/bleed easily.  Psychiatric/Behavioral:  Negative for confusion and sleep disturbance. The patient is not nervous/anxious.    PE;    07/13/2024    1:49 PM 07/13/2024    1:35 PM 01/23/2023    2:48 PM  Vitals with BMI  Height  5' 2.25 5' 2  Weight  116 lbs 10 oz  117 lbs 10 oz  BMI  21.16 21.5  Systolic 150 151 882  Diastolic 86 82 81  Pulse  60 64   Gen: Alert, well appearing.  Patient is oriented to person, place, time, and situation. AFFECT: pleasant, lucid thought and speech. ENT: Ears: EACs clear, normal epithelium.  TMs with good light reflex and landmarks bilaterally.  Eyes: no injection, icteris, swelling, or exudate.  EOMI, PERRLA. Nose: no drainage or turbinate edema/swelling.  No injection or focal lesion.  Mouth: lips without lesion/swelling.  Oral mucosa pink and moist.  Dentition intact and without obvious caries or gingival swelling.  Oropharynx without erythema, exudate, or swelling.  Neck: supple/nontender.  No LAD, mass, or TM.  Carotid pulses 2+ bilaterally, without bruits. CV: RRR, no m/r/g.   LUNGS: CTA bilat, nonlabored resps, good aeration in all lung fields. ABD: soft, NT, ND, BS normal.  No hepatospenomegaly or mass.  No bruits. EXT: no clubbing, cyanosis, or edema.  Musculoskeletal: no joint swelling, erythema, warmth, or tenderness.  ROM of all joints intact. Skin - no sores or suspicious lesions or rashes or color changes  Pertinent labs:  Lab Results  Component Value Date   TSH 0.591 07/03/2007   Lab Results  Component Value Date   WBC 14.7 (H) 12/23/2007   HGB 9.6 (L) 12/23/2007   HCT 28.1 (L) 12/23/2007   MCV 85.3 12/23/2007   PLT 227 12/23/2007   Lab Results  Component Value Date   CREATININE 0.45 07/03/2007   BUN 13 07/03/2007   NA 139 07/03/2007   K 4.7 07/03/2007   CL 104 07/03/2007   CO2 20 07/03/2007   Lab Results  Component Value Date   ALT 13 07/03/2007   AST 19 07/03/2007   ALKPHOS 55 07/03/2007   BILITOT 0.3 07/03/2007   ASSESSMENT AND PLAN:   #1 health maintenance exam: Reviewed age and gender appropriate health maintenance issues (prudent diet, regular exercise, health risks of tobacco and excessive alcohol, use of seatbelts, fire alarms in home, use of sunscreen).  Also reviewed age  and gender appropriate health screening as well as vaccine recommendations. Vaccines: Prevnar 20-->given today. Labs: cbc, cmet, lipids Cervical ca screening: Most recent Pap smear approximately 2010--> declined any further cervical cancer screening. Breast ca screening: BI-RADS 1 04/08/23--> Mammogram ordered today. Colon ca screening: Referral to gastroenterology for colonoscopy ordered last visit, 01/23/23.  She declines new referral.  2.  Hypertension, uncontrolled.   She declines medication. She will work on nonpharmacologic approaches.  3.  An After Visit Summary was printed and given to the patient.  FOLLOW UP:  No follow-ups on file.  Signed:  Gerlene Hockey, MD           07/13/2024

## 2024-07-13 NOTE — Addendum Note (Signed)
 Addended by: FLETA CARE D on: 07/13/2024 02:16 PM   Modules accepted: Orders

## 2024-07-13 NOTE — Patient Instructions (Addendum)

## 2024-07-14 ENCOUNTER — Ambulatory Visit: Payer: Self-pay | Admitting: Family Medicine

## 2024-07-14 LAB — COMPREHENSIVE METABOLIC PANEL WITH GFR
ALT: 9 U/L (ref 0–35)
AST: 17 U/L (ref 0–37)
Albumin: 4.1 g/dL (ref 3.5–5.2)
Alkaline Phosphatase: 88 U/L (ref 39–117)
BUN: 11 mg/dL (ref 6–23)
CO2: 28 meq/L (ref 19–32)
Calcium: 8.8 mg/dL (ref 8.4–10.5)
Chloride: 105 meq/L (ref 96–112)
Creatinine, Ser: 0.59 mg/dL (ref 0.40–1.20)
GFR: 99.95 mL/min (ref >60.00)
Glucose, Bld: 76 mg/dL (ref 70–99)
Potassium: 4 meq/L (ref 3.5–5.1)
Sodium: 143 meq/L (ref 135–145)
Total Bilirubin: 0.5 mg/dL (ref 0.2–1.2)
Total Protein: 6.7 g/dL (ref 6.0–8.3)

## 2024-07-14 LAB — LIPID PANEL
Cholesterol: 201 mg/dL — ABNORMAL HIGH (ref 0–200)
HDL: 66 mg/dL (ref >39.00)
LDL Cholesterol: 121 mg/dL — ABNORMAL HIGH (ref 0–99)
NonHDL: 134.71
Total CHOL/HDL Ratio: 3
Triglycerides: 71 mg/dL (ref 0.0–149.0)
VLDL: 14.2 mg/dL (ref 0.0–40.0)

## 2024-07-16 ENCOUNTER — Encounter: Admitting: Family Medicine

## 2025-07-12 ENCOUNTER — Encounter: Admitting: Family Medicine

## 2025-07-16 ENCOUNTER — Encounter: Admitting: Family Medicine
